# Patient Record
Sex: Male | Born: 1965 | Race: Black or African American | Hispanic: No | Marital: Married | Smoking: Never smoker
Health system: Southern US, Community
[De-identification: ages and names within clinical notes are randomized; demographics above are authoritative.]

---

## 2008-07-18 ENCOUNTER — Ambulatory Visit (HOSPITAL_COMMUNITY): Admission: RE | Admit: 2008-07-18 | Discharge: 2008-07-18 | Payer: Self-pay | Admitting: Internal Medicine

## 2008-07-18 ENCOUNTER — Ambulatory Visit: Payer: Self-pay | Admitting: Internal Medicine

## 2008-08-24 ENCOUNTER — Ambulatory Visit: Payer: Self-pay | Admitting: Internal Medicine

## 2008-11-23 ENCOUNTER — Ambulatory Visit: Payer: Self-pay | Admitting: Internal Medicine

## 2008-11-23 LAB — CONVERTED CEMR LAB
Cholesterol: 192 mg/dL (ref 0–200)
LDL Cholesterol: 128 mg/dL — ABNORMAL HIGH (ref 0–99)
Total CHOL/HDL Ratio: 3.8
VLDL: 13 mg/dL (ref 0–40)

## 2008-12-13 ENCOUNTER — Ambulatory Visit: Payer: Self-pay | Admitting: *Deleted

## 2010-06-01 IMAGING — CR DG KNEE 1-2V BILAT
4 series · 4 of 4 positions shown · non-contrast
Comparison: None

CLINICAL DATA: History given of bilateral knee pain.

BILATERAL KNEE - 1-2 VIEW

[t knee ap right]
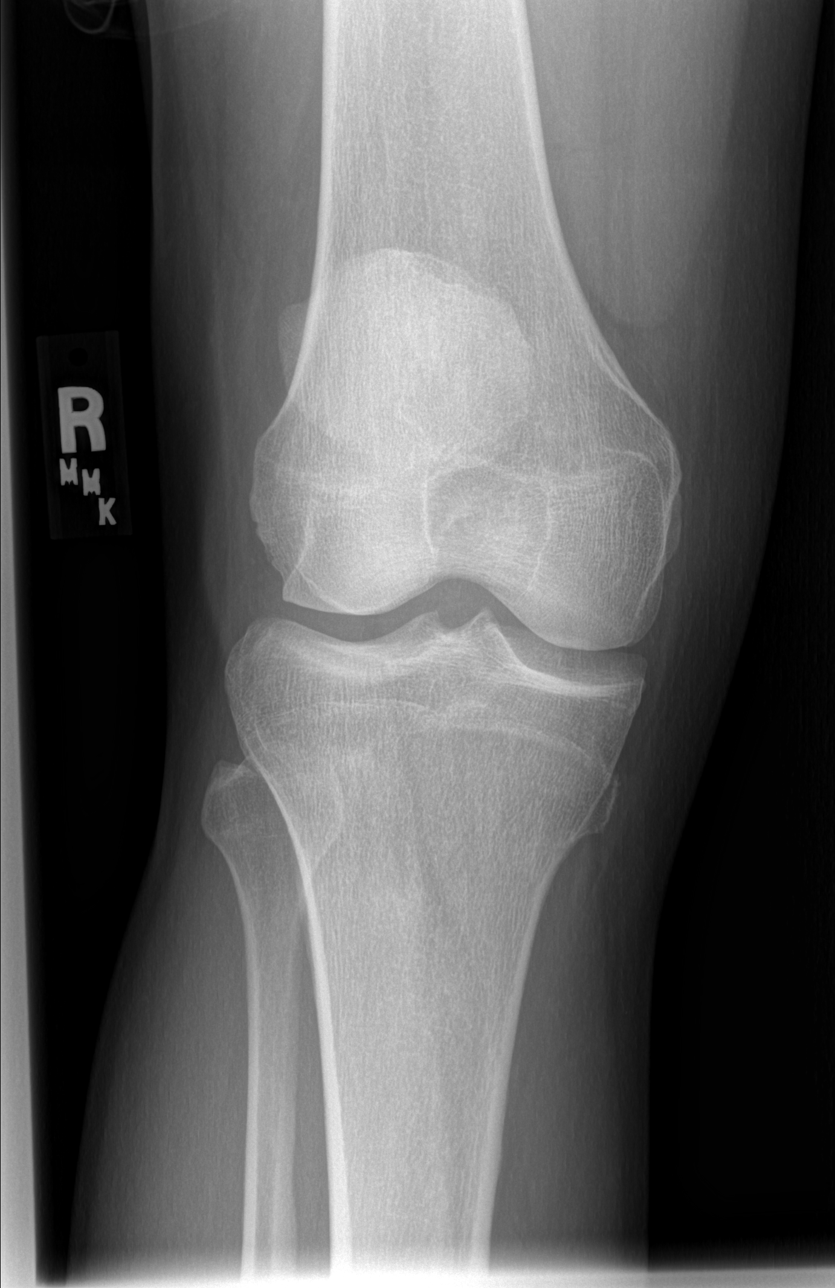

[t knee ap left]
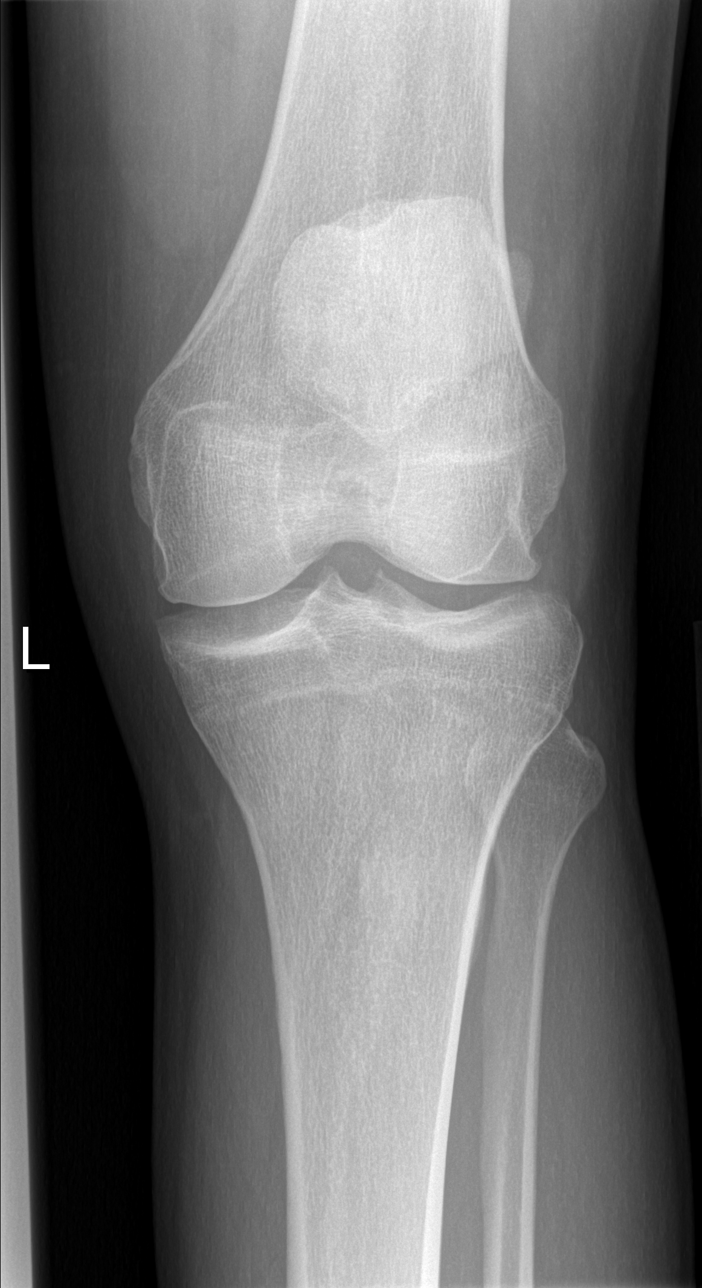

[t knee lat left]
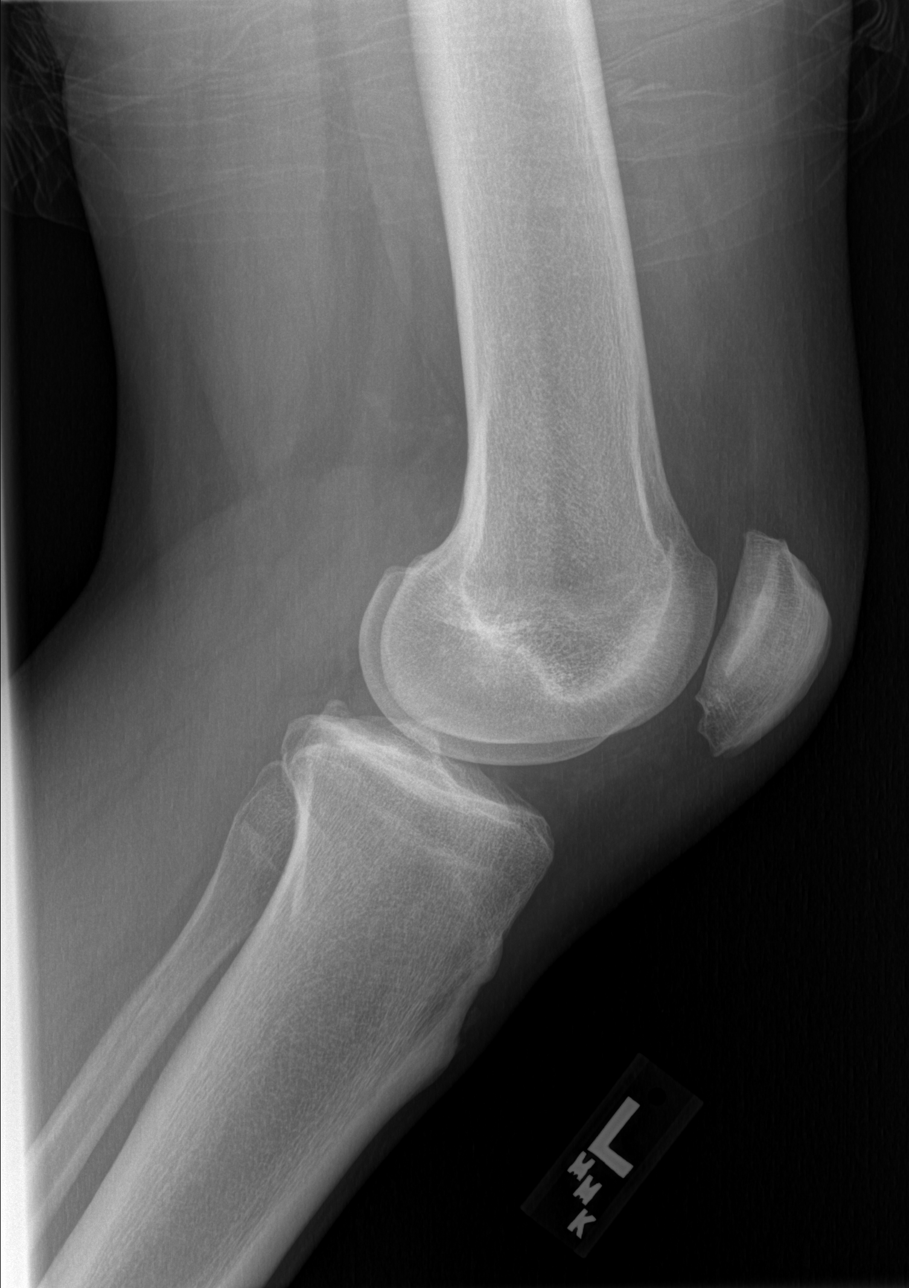

[t knee lat right]
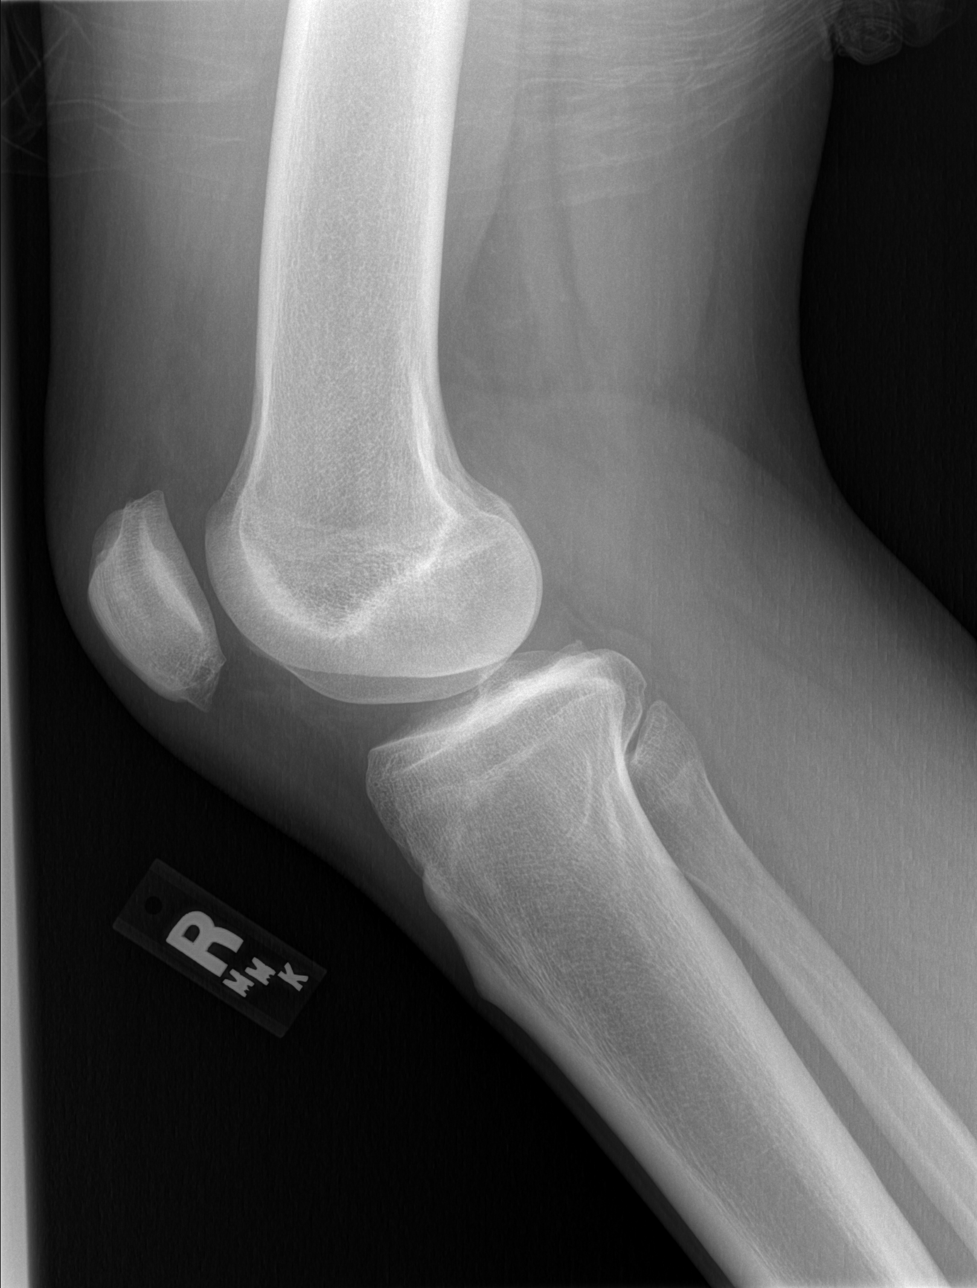

[4 of 4 positions shown; findings below may reference images not displayed]

FINDINGS: Joint spaces are preserved.  No joint effusion is
evident.  Very minimal beginning marginal osteophyte formation is
developing involving the posterior inferior aspect of the patella
bilaterally but it is quite minimal.  No erosive change or fracture
or bony destruction is seen.  No chondrocalcinosis or loose body is
evident.

The bony contour of the proximal metaphysis medial aspect of the
proximal right tibia shows a developmental bulge.  This is a normal
variant.
IMPRESSION: Very minimal beginning patellar spurring is seen.  No acute process
is evident.

## 2014-06-16 ENCOUNTER — Ambulatory Visit (INDEPENDENT_AMBULATORY_CARE_PROVIDER_SITE_OTHER): Payer: Self-pay | Admitting: Family Medicine

## 2014-06-16 VITALS — BP 154/102 | HR 100 | Temp 98.2°F | Resp 16 | Ht 76.5 in | Wt 257.0 lb

## 2014-06-16 DIAGNOSIS — IMO0001 Reserved for inherently not codable concepts without codable children: Secondary | ICD-10-CM

## 2014-06-16 DIAGNOSIS — Z021 Encounter for pre-employment examination: Secondary | ICD-10-CM

## 2014-06-16 DIAGNOSIS — Z024 Encounter for examination for driving license: Secondary | ICD-10-CM

## 2014-06-16 DIAGNOSIS — R030 Elevated blood-pressure reading, without diagnosis of hypertension: Secondary | ICD-10-CM

## 2014-06-16 NOTE — Patient Instructions (Addendum)
Your blood pressure rmained elevated in the office today.  I can provide a temporary card for 3 months, but recommend evaluation of your blood pressure with a primary care provider. Keep a record of your blood pressures outside of the office and bring them to that office visit.  See handouts on blood pressure and other info below. Let me know if I can help any further.   Return to the clinic or go to the nearest emergency room if any of your symptoms worsen or new symptoms occur.  Hypertension Hypertension, commonly called high blood pressure, is when the force of blood pumping through your arteries is too strong. Your arteries are the blood vessels that carry blood from your heart throughout your body. A blood pressure reading consists of a higher number over a lower number, such as 110/72. The higher number (systolic) is the pressure inside your arteries when your heart pumps. The lower number (diastolic) is the pressure inside your arteries when your heart relaxes. Ideally you want your blood pressure below 120/80. Hypertension forces your heart to work harder to pump blood. Your arteries may become narrow or stiff. Having hypertension puts you at risk for heart disease, stroke, and other problems.  RISK FACTORS Some risk factors for high blood pressure are controllable. Others are not.  Risk factors you cannot control include:   Race. You may be at higher risk if you are African American.  Age. Risk increases with age.  Gender. Men are at higher risk than women before age 48 years. After age 165, women are at higher risk than men. Risk factors you can control include:  Not getting enough exercise or physical activity.  Being overweight.  Getting too much fat, sugar, calories, or salt in your diet.  Drinking too much alcohol. SIGNS AND SYMPTOMS Hypertension does not usually cause signs or symptoms. Extremely high blood pressure (hypertensive crisis) may cause headache, anxiety,  shortness of breath, and nosebleed. DIAGNOSIS  To check if you have hypertension, your health care provider will measure your blood pressure while you are seated, with your arm held at the level of your heart. It should be measured at least twice using the same arm. Certain conditions can cause a difference in blood pressure between your right and left arms. A blood pressure reading that is higher than normal on one occasion does not mean that you need treatment. If one blood pressure reading is high, ask your health care provider about having it checked again. TREATMENT  Treating high blood pressure includes making lifestyle changes and possibly taking medicine. Living a healthy lifestyle can help lower high blood pressure. You may need to change some of your habits. Lifestyle changes may include:  Following the DASH diet. This diet is high in fruits, vegetables, and whole grains. It is low in salt, red meat, and added sugars.  Getting at least 2 hours of brisk physical activity every week.  Losing weight if necessary.  Not smoking.  Limiting alcoholic beverages.  Learning ways to reduce stress. If lifestyle changes are not enough to get your blood pressure under control, your health care provider may prescribe medicine. You may need to take more than one. Work closely with your health care provider to understand the risks and benefits. HOME CARE INSTRUCTIONS  Have your blood pressure rechecked as directed by your health care provider.   Take medicines only as directed by your health care provider. Follow the directions carefully. Blood pressure medicines must be taken as  prescribed. The medicine does not work as well when you skip doses. Skipping doses also puts you at risk for problems.   Do not smoke.   Monitor your blood pressure at home as directed by your health care provider. SEEK MEDICAL CARE IF:   You think you are having a reaction to medicines taken.  You have  recurrent headaches or feel dizzy.  You have swelling in your ankles.  You have trouble with your vision. SEEK IMMEDIATE MEDICAL CARE IF:  You develop a severe headache or confusion.  You have unusual weakness, numbness, or feel faint.  You have severe chest or abdominal pain.  You vomit repeatedly.  You have trouble breathing. MAKE SURE YOU:   Understand these instructions.  Will watch your condition.  Will get help right away if you are not doing well or get worse. Document Released: 03/17/2005 Document Revised: 08/01/2013 Document Reviewed: 01/07/2013 [facility] Patient Information 2015 [place], [place]. This information is not intended to replace advice given to you by your health care provider. Make sure you discuss any questions you have with your health care provider.

## 2014-06-16 NOTE — Progress Notes (Addendum)
This chart was scribed for [name], MD by [name], Medical Scribe. This patient was seen in room 9 and the patient's care was started at 4:46 PM.  Subjective:    Patient ID: Peter Blanchard, male    DOB: 10/21/1965, 49 y.o.   MRN: [id number]  Chief Complaint  Patient presents with  . Employment Physical    To reinstate CDL    HPI Peter Blanchard is a 49 y.o. male is here for a DOT PE. Paperwork reviewed and no medical problems listed.   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 Pt states that he is feeling well today and denies taking any BP medication.  He denies any fever, neck pain, sore throat, visual disturbance, CP, cough, SOB, abdominal pain, nausea, emesis, diarrhea, urinary symptoms, back pain, HA, weakness, numbness and rash as associated symptoms.    Pt denies any family h/o of HTN  There are no active problems to display for this patient.  History reviewed. No pertinent past medical history. History reviewed. No pertinent past surgical history. No Known Allergies Prior to Admission medications   Not on File   History   Social History  . Marital Status: Married    Spouse Name: N/A  . Number of Children: N/A  . Years of Education: N/A   Occupational History  . Not on file.   Social History Main Topics  . Smoking status: Never Smoker   . Smokeless tobacco: Not on file  . Alcohol Use: Not on file  . Drug Use: Not on file  . Sexual Activity: Not on file   Other Topics Concern  . Not on file   Social History Narrative  . No narrative on file     Review of Systems  Constitutional: Negative for fatigue and unexpected weight change.  Eyes: Negative for visual disturbance.  Respiratory: Negative for cough, chest tightness and shortness of breath.   Cardiovascular: Negative for chest pain, palpitations and leg swelling.  Gastrointestinal: Negative  for abdominal pain and blood in stool.  Neurological: Negative for dizziness, light-headedness and headaches.  No positive responses on driver health hx form Objective:   Physical Exam  Constitutional: He is oriented to person, place, and time. He appears well-developed and well-nourished.  HENT:  Head: Normocephalic and atraumatic.  Right Ear: External ear normal.  Left Ear: External ear normal.  Nose: Nose normal.  Mouth/Throat: Oropharynx is clear and moist. No oropharyngeal exudate.  Eyes: Conjunctivae and EOM are normal. Pupils are equal, round, and reactive to light.  Neck: Normal range of motion. Carotid bruit is not present. No thyromegaly present.  Cardiovascular: Normal rate, regular rhythm and normal heart sounds.   No murmur heard. Pulmonary/Chest: Effort normal and breath sounds normal. He has no rales.  Musculoskeletal: Normal range of motion. He exhibits no edema.  Lymphadenopathy:    He has no cervical adenopathy.  Neurological: He is alert and oriented to person, place, and time.  Skin: Skin is warm and dry.  Psychiatric: He has a normal mood and affect.  Vitals reviewed.   Filed Vitals:   06/16/14 1529  Pulse: 100  Temp: 98.2 F (36.8 C)  TempSrc: Oral  Resp: 16  Height: 6' 4.5" (1.943 m)  Weight: 257 lb (116.574 kg)  SpO2: 100%    Visual Acuity Screening   Right eye Left eye Both eyes  Without correction:  2015 -1 20/15 -1 20/13 -1  With correction:     Comments: Peripheral-85  Color- 8/8  Hearing Screening Comments: Whisper test: Left- 10 feet Right- 10 feet    Assessment & Plan:   Peter Blanchard is a 49 y.o. male Elevated blood pressure  - multiple rechecks, still elevated. Suspect HTN, not just white coat HTN. Asymptomatic.   - handouts given, recommended follow up with primary care provider. rtc precautions.    Encounter for commercial driving license (CDL) exam  - 3 month card for elevated BP. See forms.   No orders of the defined  types were placed in this encounter.   Patient Instructions  Your blood pressure rmained elevated in the office today.  I can provide a temporary card for 3 months, but recommend evaluation of your blood pressure with a primary care provider. Keep a record of your blood pressures outside of the office and bring them to that office visit.  See handouts on blood pressure and other info below. Let me know if I can help any further.   Return to the clinic or go to the nearest emergency room if any of your symptoms worsen or new symptoms occur.  Hypertension Hypertension, commonly called high blood pressure, is when the force of blood pumping through your arteries is too strong. Your arteries are the blood vessels that carry blood from your heart throughout your body. A blood pressure reading consists of a higher number over a lower number, such as 110/72. The higher number (systolic) is the pressure inside your arteries when your heart pumps. The lower number (diastolic) is the pressure inside your arteries when your heart relaxes. Ideally you want your blood pressure below 120/80. Hypertension forces your heart to work harder to pump blood. Your arteries may become narrow or stiff. Having hypertension puts you at risk for heart disease, stroke, and other problems.  RISK FACTORS Some risk factors for high blood pressure are controllable. Others are not.  Risk factors you cannot control include:   Race. You may be at higher risk if you are African American.  Age. Risk increases with age.  Gender. Men are at higher risk than women before age 61 years. After age 87, women are at higher risk than men. Risk factors you can control include:  Not getting enough exercise or physical activity.  Being overweight.  Getting too much fat, sugar, calories, or salt in your diet.  Drinking too much alcohol. SIGNS AND SYMPTOMS Hypertension does not usually cause signs or symptoms. Extremely high blood  pressure (hypertensive crisis) may cause headache, anxiety, shortness of breath, and nosebleed. DIAGNOSIS  To check if you have hypertension, your health care provider will measure your blood pressure while you are seated, with your arm held at the level of your heart. It should be measured at least twice using the same arm. Certain conditions can cause a difference in blood pressure between your right and left arms. A blood pressure reading that is higher than normal on one occasion does not mean that you need treatment. If one blood pressure reading is high, ask your health care provider about having it checked again. TREATMENT  Treating high blood pressure includes making lifestyle changes and possibly taking medicine. Living a healthy lifestyle can help lower high blood pressure. You may need to change some of your habits. Lifestyle changes may include:  Following the DASH diet. This diet is high in fruits, vegetables, and whole grains. It is low in salt,  red meat, and added sugars.  Getting at least 2 hours of brisk physical activity every week.  Losing weight if necessary.  Not smoking.  Limiting alcoholic beverages.  Learning ways to reduce stress. If lifestyle changes are not enough to get your blood pressure under control, your health care provider may prescribe medicine. You may need to take more than one. Work closely with your health care provider to understand the risks and benefits. HOME CARE INSTRUCTIONS  Have your blood pressure rechecked as directed by your health care provider.   Take medicines only as directed by your health care provider. Follow the directions carefully. Blood pressure medicines must be taken as prescribed. The medicine does not work as well when you skip doses. Skipping doses also puts you at risk for problems.   Do not smoke.   Monitor your blood pressure at home as directed by your health care provider. SEEK MEDICAL CARE IF:   You think you  are having a reaction to medicines taken.  You have recurrent headaches or feel dizzy.  You have swelling in your ankles.  You have trouble with your vision. SEEK IMMEDIATE MEDICAL CARE IF:  You develop a severe headache or confusion.  You have unusual weakness, numbness, or feel faint.  You have severe chest or abdominal pain.  You vomit repeatedly.  You have trouble breathing. MAKE SURE YOU:   Understand these instructions.  Will watch your condition.  Will get help right away if you are not doing well or get worse. Document Released: 03/17/2005 Document Revised: 08/01/2013 Document Reviewed: 01/07/2013 [facility] Patient Information 2015 [place], [place]. This information is not intended to replace advice given to you by your health care provider. Make sure you discuss any questions you have with your health care provider.    I personally performed the services described in this documentation, which was scribed in my presence. The recorded information has been reviewed and considered, and addended by me as needed.
